# Patient Record
Sex: Male | Born: 1985 | Race: Black or African American | Hispanic: No | Marital: Single | Smoking: Never smoker
Health system: Southern US, Community
[De-identification: ages and names within clinical notes are randomized; demographics above are authoritative.]

## PROBLEM LIST (undated history)

## (undated) DIAGNOSIS — I456 Pre-excitation syndrome: Secondary | ICD-10-CM

## (undated) DIAGNOSIS — I471 Supraventricular tachycardia, unspecified: Secondary | ICD-10-CM

## (undated) DIAGNOSIS — A6000 Herpesviral infection of urogenital system, unspecified: Secondary | ICD-10-CM

## (undated) DIAGNOSIS — A568 Sexually transmitted chlamydial infection of other sites: Secondary | ICD-10-CM

## (undated) HISTORY — DX: Supraventricular tachycardia: I47.1

## (undated) HISTORY — DX: Sexually transmitted chlamydial infection of other sites: A56.8

## (undated) HISTORY — DX: Supraventricular tachycardia, unspecified: I47.10

## (undated) HISTORY — DX: Herpesviral infection of urogenital system, unspecified: A60.00

## (undated) HISTORY — DX: Pre-excitation syndrome: I45.6

---
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
## 2006-06-03 ENCOUNTER — Emergency Department (HOSPITAL_COMMUNITY): Admission: EM | Admit: 2006-06-03 | Discharge: 2006-06-03 | Payer: Self-pay | Admitting: Family Medicine

## 2006-08-11 ENCOUNTER — Emergency Department (HOSPITAL_COMMUNITY): Admission: EM | Admit: 2006-08-11 | Discharge: 2006-08-11 | Payer: Self-pay | Admitting: Family Medicine

## 2006-08-29 ENCOUNTER — Emergency Department (HOSPITAL_COMMUNITY): Admission: EM | Admit: 2006-08-29 | Discharge: 2006-08-29 | Payer: Self-pay | Admitting: Family Medicine

## 2006-09-15 ENCOUNTER — Emergency Department (HOSPITAL_COMMUNITY): Admission: EM | Admit: 2006-09-15 | Discharge: 2006-09-15 | Payer: Self-pay | Admitting: Emergency Medicine

## 2006-11-17 DIAGNOSIS — L708 Other acne: Secondary | ICD-10-CM | POA: Insufficient documentation

## 2007-01-02 ENCOUNTER — Emergency Department (HOSPITAL_COMMUNITY): Admission: EM | Admit: 2007-01-02 | Discharge: 2007-01-02 | Payer: Self-pay | Admitting: Family Medicine

## 2007-09-19 ENCOUNTER — Encounter: Payer: Self-pay | Admitting: Internal Medicine

## 2008-07-06 ENCOUNTER — Emergency Department (HOSPITAL_COMMUNITY): Admission: EM | Admit: 2008-07-06 | Discharge: 2008-07-06 | Payer: Self-pay | Admitting: Emergency Medicine

## 2008-12-13 ENCOUNTER — Ambulatory Visit: Payer: Self-pay | Admitting: Internal Medicine

## 2008-12-13 DIAGNOSIS — J069 Acute upper respiratory infection, unspecified: Secondary | ICD-10-CM | POA: Insufficient documentation

## 2008-12-13 DIAGNOSIS — A6000 Herpesviral infection of urogenital system, unspecified: Secondary | ICD-10-CM | POA: Insufficient documentation

## 2008-12-13 DIAGNOSIS — Z8679 Personal history of other diseases of the circulatory system: Secondary | ICD-10-CM

## 2011-02-05 NOTE — Op Note (Signed)
NAME:  Roberto Bowen, Roberto Bowen                          ACCOUNT NO.:  [id number]   MEDICAL RECORD NO.:  [id number]                   PATIENT TYPE:  OIB   LOCATION:  2854                                 FACILITY:  [facility]   PHYSICIAN:  Doylene Canning. Ladona Ridgel, M.D. [facility]           DATE OF BIRTH:  April 15, 1986   DATE OF PROCEDURE:  09/19/2002  DATE OF DISCHARGE:                                 OPERATIVE REPORT   PROCEDURE:  Invasive electrophysiologic study with radiofrequency ablation  of a concealed right posterolateral accessory pathway resulting in the  initiation of AV re-entrant tachycardia.   INTRODUCTION:  The patient is a very pleasant 25 year old young man with a  several year history of tachypalpitations.  He has had documented SVT at  rates of up to 230 beats per minute.  This will typically stop with  adenosine.  The patient has been treated with beta blockers as well as  procainamide but had continued symptoms and is now referred for  electrophysiologic study and catheter ablation.   DESCRIPTION OF PROCEDURE:  After informed consent was obtained, the patient  was taken to the diagnostic EP lab in the fasted state.  After the usual  preparation and draping, intravenous fentanyl and midazolam were given for  sedation.  A 6 French hexapolar catheter was inserted percutaneously in the  right jugular vein and advanced to the coronary sinus.  A 5 French  quadripolar catheter was inserted percutaneously in the right femoral vein  and advanced to the His bundle region.  A 5 French quadripolar catheter was  inserted percutaneously in the right femoral vein and advanced to the RV  apex.  After measurement of the basic intervals, rapid ventricular pacing  was carried out from the RV apex, resulting in the initiation of SVT.  During SVT the patient's QRS became both narrow as well as wide at different  times.  When it was wide, it was in a right bundle branch block  configuration.  The tachycardia could  be terminated with ventricular pacing.  The atrial activation sequence initially was earliest in the His A.  It  should be noted that PVCs placed at the time of His bundle refractoriness  could pre-excite the atrium.  In addition, the tachycardia could be  terminated with atrial pacing.  During tachycardia the patient's RR interval  changed between right bundle branch block and narrow QRS tachycardia.  During right bundle branch block the cycle length was approximately 30 msec  longer than during narrow QRS conduction.  This strongly suggested a right-  sided accessory pathway.  In addition, the [place] time was markedly prolonged  during right bundle branch block compared to narrow QRS conduction.  Because  of the high likelihood of a right-sided accessory pathway, the 5 [place]  catheter was removed from the right ventricle and the 7 French 20-pole halo  catheter was inserted and placed into the right atrium.  Tachycardia was  again initiated, and this demonstrated the earliest atrial activation in  tachycardia to be in the posterolateral region of the tricuspid valve,  approximately 7 o'clock on the valve annulus.  The 7 French quadripolar  ablation catheter (Cordis) was subsequently advanced into the right  ventricle and withdrawn back into the tricuspid valve region.  Mapping was  carried out with the ablation catheter in tachycardia.  A total of 6 RF  energy applications, including four bonus RF energy applications, were  delivered.  These were delivered predominantly to the atrial insertion of  the pathway.  Following the second RF energy application, there was no  evidence of any inducible tachycardia.  As noted, four bonus RF energy  applications were delivered.  Pacing was then carried out from the right  ventricle as well as the right atrium, and this demonstrated no inducible  SVT.  In addition, there was no evidence of any accessory pathway  conduction.  Isoproterenol was then  infused and additional pacing was  carried out, both from the coronary sinus as well as the right atrium and  the right ventricle.  Programmed atrial stimulation, rapid atrial pacing,  rapid ventricular pacing, and programmed ventricular stimulation were all  carried out down to refractoriness, and there was no inducible SVT.  Adenosine was then infused (12 mg) during ventricular pacing, and this  resulted in the initiation of atrial fibrillation, which degenerated into  atrial flutter.  At this point the patient was observed for several minutes  and his atrial flutter terminated.  The catheters were then removed,  hemostasis was assured, and the patient returned to his room in satisfactory  condition.   COMPLICATIONS:  There were no immediate procedural complications.   RESULTS:  A.  BASELINE ELECTROCARDIOGRAM:  The baseline ECG demonstrates  normal sinus rhythm with a short PR interval but no overt pre-excitation.   B.  BASELINE INTERVALS:  The sinus node cycle length was 815 msec.  The AH  interval was 57 msec, the HV interval 47 msec, the QRS duration  approximately 75 msec.   C.  RAPID VENTRICULAR PACING:  Rapid ventricular pacing was carried out from  the RV apex demonstrating a VA Wenckebach cycle length of 300 msec.  During  isoproterenol infusion the VA Wenckebach cycle length was 260 msec.   D.  PROGRAMMED VENTRICULAR STIMULATION:  Programmed ventricular stimulation  was carried out from the RV apex at basic drive cycle lengths of both 500  and 400 msec.  The S1-S2 interval was stepwise decreased down to ventricular  refractoriness.  The retrograde AV node ERP was subsequently found to be  500/280.   E.  RAPID ATRIAL PACING:  Rapid atrial pacing was carried out from the  coronary sinus at a pacing cycle length of 500 msec and stepwise decreased  down to 380 msec following catheter ablation,  where AV Wenckebach was observed.  During rapid atrial pacing the PR interval  remained less than the  RR interval.  Following catheter ablation there was no inducible SVT with  rapid atrial pacing.   F.  PROGRAMMED ATRIAL STIMULATION:  Programmed atrial stimulation was  carried out from the coronary sinus at a basic drive cycle length of 161  msec.  The S1-S2 interval was stepwise decreased from 440 msec down to 310  msec, where the AV node ERP was observed.  During programmed atrial  stimulation there were no AH jumps.  There were occasional echo beats.  In  addition, during programmed atrial stimulation there was inducible SVT prior  to ablation but not after ablation.   G.  ARRHYTHMIAS OBSERVED:  1. AV reentrant tachycardia.  Initiation:  Rapid atrial and rapid     ventricular pacing.  Duration:  Sustained.  Termination:  With rapid     ventricular pacing and catheter ablation.  2. Atrial flutter.  Initiation:  During adenosine infusion with concomitant     isoproterenol.  Duration:  Sustained.  Termination:  Spontaneous.   H.  MAPPING:  Mapping of the patient's SVT demonstrated a right  posterolateral accessory pathway, approximately 7 o'clock on the tricuspid  valve annulus.   A. RADIOFREQUENCY ENERGY APPLICATION:  A total of six RF energy applications     were delivered.  During the second RF energy application there was no     evidence of residual accessory pathway conduction.  Four bonus RF energy     applications were delivered predominantly to the atrial insertion of the     accessory pathway approximately 7 o'clock on the tricuspid valve annulus.   CONCLUSION:  This study demonstrates concealed right posterolateral  accessory pathway and is status post successful electrophysiologic study and  catheter ablation with a total of six RF energy applications delivered to  the pathway, rendering the tachycardia noninducible and demonstrating no  evidence of any residual accessory pathway conduction.  In addition, the  patient had atrial flutter during  adenosine infusion on isoproterenol.  This  is of unknown clinical significance.  As it stopped spontaneously, no  ablation therapy was delivered to the atrial flutter and no isthmus  modification of atrial flutter was carried out, as the patient did not  appear to have atrial flutter in any clinical sense.                                                Doylene Canning. Ladona Ridgel, M.D. [facility]    GWT/MEDQ  D:  09/19/2002  T:  09/19/2002  Job:  [id number]   cc:   Elsie Stain, M.D.  [facility]  [address]  [place] [address]  Fax: [phone]   Pricilla Riffle, M.D. [facility]  [address]  [place]  [place] [address]  Fax: [phone]   Duke Salvia, M.D. [facility]   Kathrine Cords, [facility]

## 2011-02-05 NOTE — Discharge Summary (Signed)
   NAME:  Roberto Bowen, Roberto Bowen NO.:  [id number]   MEDICAL RECORD NO.:  [id number]                   PATIENT TYPE:  OIB   LOCATION:  2031                                 FACILITY:  [facility]   PHYSICIAN:  Doylene Canning. Ladona Ridgel, M.D. [facility]           DATE OF BIRTH:  [birth year]   DATE OF ADMISSION:  09/19/2002  DATE OF DISCHARGE:  09/20/2002                           DISCHARGE SUMMARY - REFERRING   SUMMARY OF HISTORY:  The patient is a 25 year old black male who has  presented to the hospital in the past with incessant supraventricular  tachycardia that has failed to respond to adenosine, Cardizem, Lopressor,  and ultimately required procainamide.  He was treated with Inderal 80 mg  without any further SVT but he describes some fatigue and sluggishness.  After being seen in the office on September 11, 2002 with Dr. Graciela Husbands, they  discussed options of definitive treatment.  The patient and the mother  agreed to proceed with possible ablation.   LABORATORY AND ACCESSORY DATA:  Preadmission H&H was 15.4 and 47.7, normal  indices, platelets 464, WBC 7.6.  PTT 28.2, PT 11.7.  Sodium 139, potassium  4.2, BUN 9. creatinine 9.8.   Echocardiogram showed normal LV function, mild MR.  EKG showed sinus  bradycardia with ventricular rate of 59,  P-R interval was 130.   HOSPITAL COURSE:  The patient was brought in on December 31st and underwent  EPS and RFA of SVT without immediate complications by Dr. Ladona Ridgel.  Please  refer to his dictated note.  Postprocedure, he was doing well, was  ambulating in the hall without difficulty after his bed rest on September 20, 2002.  After review, Dr. Ladona Ridgel felt that he could be discharged home after  his ablation of the posterior lateral pathway.   DISCHARGE INSTRUCTIONS:  He gave him instructions of no strenuous activity  until September 22, 2002.  He was given permission to go back to school on  January 6th.   DISCHARGE MEDICATIONS:  He also  recommended aspirin 325 mg for one month and  antibiotic prophylaxis for six months for any dental work.   FOLLOW UP:  He will follow up with Dr. Ladona Ridgel in approximately 6-12 weeks.   DISCHARGE DIAGNOSIS:  Supraventricular tachycardia status post ablation.     Roberto Bowen, P.A. [facility]                    Doylene Canning. Ladona Ridgel, M.D. [facility]    EW/MEDQ  D:  09/20/2002  T:  09/20/2002  Job:  [id number]

## 2011-02-05 NOTE — Consult Note (Signed)
NAME:  Roberto Bowen, DUCE NO.:  [id number]   MEDICAL RECORD NO.:  [id number]                   PATIENT TYPE:  EMS   LOCATION:  MAJO                                 FACILITY:  [facility]   PHYSICIAN:  Duke Salvia, M.D. [facility]           DATE OF BIRTH:  [birth year]   DATE OF CONSULTATION:  08/26/2002  DATE OF DISCHARGE:                                   CONSULTATION   REASON FOR CONSULTATION:  This is a 25 year old African American male who  presented to the emergency room with known complex tachycardia.  He had  received 12 mg of adenosine per Dr. Pearletha Alfred, with termination, then  resumption of his tachycardia; because of that, consultation was requested.   HISTORY OF PRESENT ILLNESS:  He has a two-year history of recurrent abrupt  onset of tachy-palpitations that have been occurring recently a couple of  times a week.  They typically last 8 to 10 minutes, are associated with some  lightheadedness, shortness of breath and chest pain.  This morning, he  awakened with his rapid heart beat with persistent chest pain, shortness of  breath and presyncope.  He came to the emergency room with a history as  above.   He has no history of exercise intolerance.   PAST MEDICAL HISTORY:  His past medical history is largely negative.  Vaccines are up to date.   HABITS:  He does not use cigarettes, alcohol or recreational drugs.   FAMILY HISTORY:  He has seven siblings and lives with his mother.   SOCIAL HISTORY:  He is a Recruitment consultant.  He loves playing basketball.  He has episodes that occur both with exercise and with rest.   ALLERGIES:  He has no known drug allergies.   PHYSICAL EXAMINATION:  GENERAL:  On examination, he is a young [place]  American male in no acute distress.  VITAL SIGNS:  His blood pressure was auscultated between 70 and 105 with  heart rates at the 220 range.  HEENT:  Exam demonstrated no scleral icterus and no xanthomata.  NECK:   His neck veins were pulsating.  BACK:  His back was without kyphosis or scoliosis.  LUNGS:  His lungs were clear.  HEART:  His heart sounds were regular.  No murmurs or gallops were  appreciated.  ABDOMEN:  The abdomen was soft with active bowel sounds.  Femoral pulses  were 2+.  EXTREMITIES:  Distal pulses were intact and there was no clubbing, cyanosis,  or edema.  NEUROLOGIC:  The neurological exam was grossly normal.  SKIN:  The skin was warm and dry.   LABORATORY AND ACCESSORY CLINICAL DATA:  Electrocardiogram demonstrated a  narrow QRS tachycardia with cycle length of 260 msec.   Review of the strips with adenosine demonstrated termination of the  tachycardia with resumption with a PVC.  In termination, there appeared to  be no isoelectrical P-R  interval.  A short run with a 12-lead at that same  time also demonstrated no evidence of isoelectric P-R interval, particularly  the __________.   IMPRESSION:  Supraventricular tachycardia probably associated with  ventricular preexcitation, i.e., Wolff-Parkinson-White syndrome.   RECOMMENDATIONS:  1. The following is an outline of the plan that was recommended and     accomplished and is dictated as such:  Initially, we tried adenosine     again and this was again associated with termination of the tachycardia     and resumption.  With a defibrillator then the room, verapamil 2.5 mg was     given twice, with some slowing of the tachycardia, but then the patient     developed a significant irregularity and wide complex beats consistent     with aberration or preexcitation.  Occasional sinus beats were seen,     suggesting that there was a beat of atrial irritability.  Ultimately, the     patient developed no evidence of intrinsic sinus rhythm; atrial     fibrillation was clearly present.  In this interlude, however,     procainamide was started at 70 mg/kg over 20 minutes.  Because there was     lack of effect at the end of the  infusion of this, anesthesia was called     to anticipate D-C cardioversion of his atrial fibrillation.  Just prior     to this, he reverted back into SVT and then abruptly broke back into     sinus rhythm.  His electrocardiogram, post termination, demonstrates no     evidence ventricular preexcitation, albeit in the context of procainamide     infusion.   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   I will give him a prescription for Inderal LA 80 mg.  He will need a 2-D  echocardiogram and I will plan to see him in the office in two weeks' time.   I had an extensive discussion with the patient's mother and aunt regarding  potential risks of sudden cardiac death, which I would estimate at least 1%  to 3% per year; we also discussed the potential therapeutic benefits as well  as the potential risks of RF catheter ablation including, but not limited  to, 1-in-a-1000 risk of heart attacks, stroke and/or dying, a 1-in-250 risk  of perforation and 1-in-100 risk of iatrogenic heart block requiring  pacemaker implantation.                                               Duke Salvia, M.D. [facility]    SCK/MEDQ  D:  08/26/2002  T:  08/27/2002  Job:  [id number]

## 2014-02-06 ENCOUNTER — Encounter: Payer: Self-pay | Admitting: Cardiovascular Disease

## 2014-02-06 ENCOUNTER — Ambulatory Visit (INDEPENDENT_AMBULATORY_CARE_PROVIDER_SITE_OTHER): Payer: PRIVATE HEALTH INSURANCE | Admitting: Cardiovascular Disease

## 2014-02-06 VITALS — BP 138/90 | HR 79 | Ht 74.0 in | Wt 208.0 lb

## 2014-02-06 DIAGNOSIS — I471 Supraventricular tachycardia: Secondary | ICD-10-CM

## 2014-02-06 DIAGNOSIS — I498 Other specified cardiac arrhythmias: Secondary | ICD-10-CM

## 2014-02-06 LAB — BASIC METABOLIC PANEL
BUN: 12 mg/dL (ref 6–23)
CO2: 29 mEq/L (ref 19–32)
Calcium: 9.6 mg/dL (ref 8.4–10.5)
Chloride: 104 mEq/L (ref 96–112)
Creatinine, Ser: 1.1 mg/dL (ref 0.4–1.5)
GFR: 109.21 mL/min (ref >60.00)
Glucose, Bld: 81 mg/dL (ref 70–99)
Potassium: 3.7 mEq/L (ref 3.5–5.1)
Sodium: 139 mEq/L (ref 135–145)

## 2014-02-06 LAB — TSH: TSH: 0.81 u[IU]/mL (ref 0.35–4.50)

## 2014-02-06 NOTE — Patient Instructions (Addendum)
You have been referred to Dr. Graciela HusbandsKlein or Dr Ladona Ridgelaylor.  Please schedule appointment for about 5-6 weeks from now.    Your physician has requested that you have an echocardiogram. Echocardiography is a painless test that uses sound waves to create images of your heart. It provides your doctor with information about the size and shape of your heart and how well your heart's chambers and valves are working. This procedure takes approximately one hour. There are no restrictions for this procedure.  Your physician has recommended that you wear an event monitor. Event monitors are medical devices that record the heart's electrical activity. Doctors most often us these monitors to diagnose arrhythmias. Arrhythmias are problems with the speed or rhythm of the heartbeat. The monitor is a small, portable device. You can wear one while you do your normal daily activities. This is usually used to diagnose what is causing palpitations/syncope (passing out).

## 2014-02-06 NOTE — Progress Notes (Signed)
     History of Present Illness: 28 yo AAM with history of SVT (WPW) s/p ablation per Dr. Ladona Ridgelaylor in 2003 who is here today as a new patient for evaluation of palpitations. He saw Dr. 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 He underwent ablation 09/19/02 per Dr. Ladona Ridgelaylor. He has not seen a doctor in years. No EP f/u over last 10 years. He tells me that he had an episode of tachycardia 2 weeks ago. He felt his heart racing while sitting down. This lasted for four hours and felt much like his prior episodes of tachycardia. He felt dyspneic during the episode. He did not have syncope. No chest pain or exertional SOB. This is his first episode of tachycardia since 2003 following ihs ablation.   Primary Care Physician: None  Past Medical History  Diagnosis Date  . SVT (supraventricular tachycardia)     Past Surgical History  Procedure Laterality Date  . Svt ablation  2003    No current outpatient prescriptions on file.   No current facility-administered medications for this visit.    All: NKDA  History   Social History  . Marital Status: Single    Spouse Name: N/A    Number of Children: 2  . Years of Education: N/A   Occupational History  . Works at [employer]    Social History Main Topics  . Smoking status: Never Smoker   . Smokeless tobacco: Not on file  . Alcohol Use: Yes     Comment: 1 drink per week  . Drug Use: 5.00 per week    Special: Marijuana  . Sexual Activity: Not on file   Other Topics Concern  . Not on file   Social History Narrative  . No narrative on file    Family History  Problem Relation Age of Onset  . Heart attack Mother   . Hypertension Mother   . Heart attack Father     Review of Systems:  As stated in the HPI and otherwise negative.   BP 138/90  Pulse 79  Ht 6\' 2"  (1.88 m)  Wt 208 lb (94.348 kg)  BMI 26.69 kg/m2  Physical Examination: General: Well developed, well nourished, NAD HEENT: OP clear, mucus membranes  moist SKIN: warm, dry. No rashes. Neuro: No focal deficits Musculoskeletal: Muscle strength 5/5 all ext Psychiatric: Mood and affect normal Neck: No JVD, no carotid bruits, no thyromegaly, no lymphadenopathy. Lungs:Clear bilaterally, no wheezes, rhonci, crackles Cardiovascular: Regular rate and rhythm. No murmurs, gallops or rubs. Abdomen:Soft. Bowel sounds present. Non-tender.  Extremities: No lower extremity edema. Pulses are 2 + in the bilateral DP/PT.  EKG: NSR, rate 79 bpm. Normal EKG. No delta waves appreciated.   Assessment and Plan:   1. SVT: He has a complex EP history with presentation in 2003 with SVT. He was seen then by Dr. Graciela HusbandsKlein and his presentation was c/w WPW. He underwent ablation per Dr. Ladona Ridgelaylor 09/19/02 and has done well since then. Now with recurrent symptoms c/w SVT. Will arrange echo to assess LVEF. Will arrange 21 day event monitor. Will check BMET, TSH. Will schedule f/u in EP clinic in 3-4 weeks. He will follow in the EP clinic.

## 2014-02-18 ENCOUNTER — Encounter: Payer: Self-pay | Admitting: Family Medicine

## 2014-02-18 ENCOUNTER — Other Ambulatory Visit (HOSPITAL_COMMUNITY)
Admission: RE | Admit: 2014-02-18 | Discharge: 2014-02-18 | Disposition: A | Discharge status: Home / Self Care | Payer: PRIVATE HEALTH INSURANCE | Source: Ambulatory Visit | Attending: Family Medicine | Admitting: Family Medicine

## 2014-02-18 ENCOUNTER — Ambulatory Visit (INDEPENDENT_AMBULATORY_CARE_PROVIDER_SITE_OTHER): Payer: PRIVATE HEALTH INSURANCE | Admitting: Family Medicine

## 2014-02-18 VITALS — BP 142/71 | HR 87 | Temp 98.9°F | Ht 74.0 in | Wt 208.0 lb

## 2014-02-18 DIAGNOSIS — Z202 Contact with and (suspected) exposure to infections with a predominantly sexual mode of transmission: Secondary | ICD-10-CM

## 2014-02-18 DIAGNOSIS — Z113 Encounter for screening for infections with a predominantly sexual mode of transmission: Secondary | ICD-10-CM | POA: Insufficient documentation

## 2014-02-18 DIAGNOSIS — A6000 Herpesviral infection of urogenital system, unspecified: Secondary | ICD-10-CM

## 2014-02-18 DIAGNOSIS — A568 Sexually transmitted chlamydial infection of other sites: Secondary | ICD-10-CM

## 2014-02-18 DIAGNOSIS — I456 Pre-excitation syndrome: Secondary | ICD-10-CM

## 2014-02-18 MED ORDER — VALACYCLOVIR HCL 1 G PO TABS
1000.0000 mg | ORAL_TABLET | Freq: Every day | ORAL | Status: DC
Start: 2014-02-18 — End: 2015-10-01

## 2014-02-18 NOTE — Assessment & Plan Note (Signed)
Reports multiple partners and requesting testing - urine gc/ct - hiv/rpr - will call with results

## 2014-02-18 NOTE — Progress Notes (Signed)
   Subjective:    Patient ID: Roberto Bowen, male    DOB: 1986-05-11, 28 y.o.   MRN: [id number]  Exposure to STD Associated symptoms include shortness of breath.   Pt presents to establish care. No active complaints. Reports multiple partners and would like STD testing.  PMH:  WPW - s/p ablation in 2003 - followed by cardiology Genital herpes - frequent outbreaks, previously on suppression  No allergies No surgeries besides ablation above  SH:  No tobacco Smokes marijuana 2x per day Alcohol - 2-3 drinks about 1x per week Multiple sexual partners - wants testing for STDs  FH: Hypertension, DM and MI in mother and father   Review of Systems  Respiratory: Positive for shortness of breath.   Cardiovascular: Positive for palpitations.  All other systems reviewed and are negative.      Objective:   Physical Exam  Nursing note and vitals reviewed. Constitutional: He is oriented to person, place, and time. He appears well-developed and well-nourished. No distress.  HENT:  Head: Normocephalic and atraumatic.  Nose: Nose normal.  Mouth/Throat: Oropharynx is clear and moist. No oropharyngeal exudate.  Eyes: Conjunctivae are normal. Pupils are equal, round, and reactive to light. Right eye exhibits no discharge. Left eye exhibits no discharge. No scleral icterus.  Neck: Normal range of motion. Neck supple.  Cardiovascular: Normal rate, regular rhythm and normal heart sounds.   No murmur heard. Pulmonary/Chest: Effort normal and breath sounds normal. No respiratory distress. He has no wheezes.  Abdominal: Soft. Bowel sounds are normal. He exhibits no distension and no mass. There is no tenderness. There is no rebound and no guarding.  Musculoskeletal: Normal range of motion. He exhibits no edema and no tenderness.  Neurological: He is alert and oriented to person, place, and time.  Skin: Skin is warm and dry. He is not diaphoretic.  Psychiatric: He has a normal mood and affect.  His behavior is normal.       Assessment & Plan:

## 2014-02-18 NOTE — Patient Instructions (Signed)
Palpitations  A palpitation is the feeling that your heartbeat is irregular or is faster than normal. It may feel like your heart is fluttering or skipping a beat. Palpitations are usually not a serious problem. However, in some cases, you may need further medical evaluation. CAUSES  Palpitations can be caused by: Smoking. Caffeine or other stimulants, such as diet pills or energy drinks. Alcohol. Stress and anxiety. Strenuous physical activity. Fatigue. Certain medicines. Heart disease, especially if you have a history of arrhythmias. This includes atrial fibrillation, atrial flutter, or supraventricular tachycardia. An improperly working pacemaker or defibrillator. DIAGNOSIS  To find the cause of your palpitations, your caregiver will take your history and perform a physical exam. Tests may also be done, including: Electrocardiography (ECG). This test records the heart's electrical activity. Cardiac monitoring. This allows your caregiver to monitor your heart rate and rhythm in real time. Holter monitor. This is a portable device that records your heartbeat and can help diagnose heart arrhythmias. It allows your caregiver to track your heart activity for several days, if needed. Stress tests by exercise or by giving medicine that makes the heart beat faster. TREATMENT  Treatment of palpitations depends on the cause of your symptoms and can vary greatly. Most cases of palpitations do not require any treatment other than time, relaxation, and monitoring your symptoms. Other causes, such as atrial fibrillation, atrial flutter, or supraventricular tachycardia, usually require further treatment. HOME CARE INSTRUCTIONS  Avoid: Caffeinated coffee, tea, soft drinks, diet pills, and energy drinks. Chocolate. Alcohol. Stop smoking if you smoke. Reduce your stress and anxiety. Things that can help you relax include: A method that measures bodily functions so you can learn to control them  (biofeedback). Yoga. Meditation. Physical activity such as swimming, jogging, or walking. Get plenty of rest and sleep. SEEK MEDICAL CARE IF:  You continue to have a fast or irregular heartbeat beyond 24 hours. Your palpitations occur more often. SEEK IMMEDIATE MEDICAL CARE IF: You develop chest pain or shortness of breath. You have a severe headache. You feel dizzy, or you faint. MAKE SURE YOU: Understand these instructions. Will watch your condition. Will get help right away if you are not doing well or get worse.

## 2014-02-18 NOTE — Assessment & Plan Note (Signed)
Many years of herpes infection, more frequent outbreaks recently, treated with valtrex in the past - restart valtrex suppression

## 2014-02-19 LAB — RPR

## 2014-02-19 LAB — HIV ANTIBODY (ROUTINE TESTING W REFLEX): HIV 1&2 Ab, 4th Generation: NONREACTIVE

## 2014-02-20 ENCOUNTER — Telehealth: Payer: Self-pay | Admitting: *Deleted

## 2014-02-20 NOTE — Telephone Encounter (Signed)
Left message for patient to return call, he needs to be notified he tested positive for chlamydia and scheduled for a nurse visit for treatment.Doris Cheadle Busick

## 2014-02-21 ENCOUNTER — Ambulatory Visit (INDEPENDENT_AMBULATORY_CARE_PROVIDER_SITE_OTHER): Payer: PRIVATE HEALTH INSURANCE | Admitting: *Deleted

## 2014-02-21 DIAGNOSIS — A568 Sexually transmitted chlamydial infection of other sites: Secondary | ICD-10-CM | POA: Insufficient documentation

## 2014-02-21 DIAGNOSIS — A749 Chlamydial infection, unspecified: Secondary | ICD-10-CM

## 2014-02-21 MED ORDER — AZITHROMYCIN 500 MG PO TABS
1000.0000 mg | ORAL_TABLET | Freq: Once | ORAL | Status: AC
  Administered 2014-02-21: 1000 mg via ORAL

## 2014-02-21 MED ORDER — AZITHROMYCIN 250 MG PO TABS: 1000.0000 mg | ORAL_TABLET | Freq: Once | ORAL | Status: DC

## 2014-02-21 NOTE — Addendum Note (Signed)
Addended by: Abram Sander on: 02/21/2014 12:07 PM   Modules accepted: Orders

## 2014-02-21 NOTE — Telephone Encounter (Signed)
Pt called and was told about his results and he scheduled a nurse visit for 6/4. jw

## 2014-02-21 NOTE — Progress Notes (Signed)
   Pt in nurse clinic for chlamydia treatment.  Pt advised no sex x 7 days or until partner has been tested and treated.  Condoms with all sex, declined condoms today.  Azithromycin  1 gm PO x 1 given per verbal order by Dr. Richarda Blade.  Clovis Pu, RN

## 2014-02-21 NOTE — Assessment & Plan Note (Signed)
Azithro 1g x1

## 2014-02-21 NOTE — Telephone Encounter (Signed)
LVM for message for patient to call back.Roberto Bowen

## 2014-02-21 NOTE — Telephone Encounter (Signed)
Form given to nurse to complete and fax to health department once patient comes in for treatment.Doris Cheadle Busick

## 2014-02-25 ENCOUNTER — Other Ambulatory Visit (HOSPITAL_COMMUNITY): Payer: PRIVATE HEALTH INSURANCE

## 2014-02-27 ENCOUNTER — Encounter (INDEPENDENT_AMBULATORY_CARE_PROVIDER_SITE_OTHER): Payer: PRIVATE HEALTH INSURANCE

## 2014-02-27 ENCOUNTER — Ambulatory Visit (HOSPITAL_COMMUNITY): Payer: PRIVATE HEALTH INSURANCE

## 2014-02-27 ENCOUNTER — Encounter: Payer: Self-pay | Admitting: *Deleted

## 2014-02-27 DIAGNOSIS — I498 Other specified cardiac arrhythmias: Secondary | ICD-10-CM

## 2014-02-27 DIAGNOSIS — I471 Supraventricular tachycardia: Secondary | ICD-10-CM

## 2014-02-27 NOTE — Progress Notes (Signed)
Patient ID: Roberto Bowen, male   DOB: Sep 13, 1986, 28 y.o.   MRN: [id number] E-Cardio verite 30 day cardiac event monitor applied to patient.

## 2014-03-13 ENCOUNTER — Encounter: Payer: Self-pay | Admitting: Internal Medicine

## 2014-03-13 ENCOUNTER — Ambulatory Visit (INDEPENDENT_AMBULATORY_CARE_PROVIDER_SITE_OTHER): Payer: PRIVATE HEALTH INSURANCE | Admitting: Internal Medicine

## 2014-03-13 VITALS — BP 136/79 | HR 73 | Ht 74.0 in | Wt 208.0 lb

## 2014-03-13 DIAGNOSIS — Z8679 Personal history of other diseases of the circulatory system: Secondary | ICD-10-CM

## 2014-03-13 DIAGNOSIS — R002 Palpitations: Secondary | ICD-10-CM

## 2014-03-13 MED ORDER — METOPROLOL TARTRATE 50 MG PO TABS: ORAL_TABLET | ORAL | Status: DC

## 2014-03-13 NOTE — Patient Instructions (Signed)
Your physician wants you to follow-up in: 6 MONTHS WITH DR. Ladona RidgelAYLOR. You will receive a reminder letter in the mail two months in advance. If you don't receive a letter, please call our office to schedule the follow-up appointment. PER DR. Ladona RidgelAYLOR IF YOU ARE FEELING FINE YOU CAN FOLLOW UP AS NEEDED  AN RX FOR METOPROLOL TARTRATE 50 MG 1 TABLET AS NEEDED FOR PALPITATIONS

## 2014-03-14 ENCOUNTER — Encounter: Payer: Self-pay | Admitting: Internal Medicine

## 2014-03-14 NOTE — Progress Notes (Signed)
      HPI 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 He had a right posteroseptal pathway. He has done well over the years. He was visiting friends at the beach when he experienced a recurrent episode of tachypalpitations. He did not seek medical attention. He admits to consuming more caffeine he should. He has worn a cardiac monitor and no arrhythmias have been observed. No syncope. When he felt palpitations, he was not sure of whether his heart was beating fast and irregular or regular. No Known Allergies   Current Outpatient Prescriptions  Medication Sig Dispense Refill  . valACYclovir (VALTREX) 1000 MG tablet Take 1 tablet (1,000 mg total) by mouth daily.  30 tablet  12  . metoprolol (LOPRESSOR) 50 MG tablet AS NEEDED FOR PALPITATIONS  30 tablet  0   No current facility-administered medications for this visit.     Past Medical History  Diagnosis Date  . SVT (supraventricular tachycardia)   . Acne   . Genital herpes   . PSVT (paroxysmal supraventricular tachycardia)   . Possible exposure to STD   . 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 Chlamydia trachomatis infection     ROS:   All systems reviewed and negative except as noted in the HPI.   Past Surgical History  Procedure Laterality Date  . Svt ablation  2003  . Ablation of dysrhythmic focus  2003     Family History  Problem Relation Age of Onset  . Heart attack Mother   . Hypertension Mother   . Heart disease Mother   . Diabetes Mother   . Heart attack Father   . Heart disease Father   . Diabetes Father   . Early death Father      History   Social History  . Marital Status: Single    Spouse Name: N/A    Number of Children: 2  . Years of Education: N/A   Occupational History  . Works at [employer]    Social History Main Topics  . Smoking status: Never Smoker   . Smokeless  tobacco: Not on file  . Alcohol Use: Yes     Comment: 1 drink per week  . Drug Use: 5.00 per week    Special: Marijuana  . Sexual Activity: Not on file   Other Topics Concern  . Not on file   Social History Narrative  . No narrative on file     BP 136/79  Pulse 73  Ht 6\' 2"  (1.88 m)  Wt 208 lb (94.348 kg)  BMI 26.69 kg/m2  Physical Exam:  Well appearing young man, NAD HEENT: Unremarkable Neck:  No JVD, no thyromegally Back:  No CVA tenderness Lungs:  Clear with no wheezes HEART:  Regular rate rhythm, no murmurs, no rubs, no clicks Abd:  soft, positive bowel sounds, no organomegally, no rebound, no guarding Ext:  2 plus pulses, no edema, no cyanosis, no clubbing Skin:  No rashes no nodules Neuro:  CN II through XII intact, motor grossly intact  EKG - nsr with no pre-excitation  Assess/Plan:

## 2014-03-14 NOTE — Assessment & Plan Note (Signed)
It is unclear whether he has had any sustained arrhythmia or whether he was experienced PVC/PAC and sinus tachycardia or SVT or atrial fib. I have recommended a period of watchful waiting. He is encouraged to come to our office or go to the fire station if he has recurrent symptoms. There is no evidence of recurrent AP conduction.

## 2014-03-20 ENCOUNTER — Other Ambulatory Visit (HOSPITAL_COMMUNITY): Payer: PRIVATE HEALTH INSURANCE

## 2015-09-21 ENCOUNTER — Emergency Department (HOSPITAL_COMMUNITY): Payer: No Typology Code available for payment source

## 2015-09-21 ENCOUNTER — Emergency Department (HOSPITAL_COMMUNITY)
Admission: EM | Admit: 2015-09-21 | Discharge: 2015-09-21 | Disposition: A | Discharge status: Home / Self Care | Payer: No Typology Code available for payment source | Attending: Emergency Medicine | Admitting: Emergency Medicine

## 2015-09-21 ENCOUNTER — Encounter (HOSPITAL_COMMUNITY): Payer: Self-pay | Admitting: Nurse Practitioner

## 2015-09-21 DIAGNOSIS — Z79899 Other long term (current) drug therapy: Secondary | ICD-10-CM | POA: Insufficient documentation

## 2015-09-21 DIAGNOSIS — Z8619 Personal history of other infectious and parasitic diseases: Secondary | ICD-10-CM | POA: Insufficient documentation

## 2015-09-21 DIAGNOSIS — R0602 Shortness of breath: Secondary | ICD-10-CM | POA: Diagnosis present

## 2015-09-21 DIAGNOSIS — I4891 Unspecified atrial fibrillation: Secondary | ICD-10-CM | POA: Diagnosis not present

## 2015-09-21 LAB — BASIC METABOLIC PANEL
ANION GAP: 11 (ref 5–15)
BUN: 11 mg/dL (ref 6–20)
CHLORIDE: 103 mmol/L (ref 101–111)
CO2: 25 mmol/L (ref 22–32)
Calcium: 9.5 mg/dL (ref 8.9–10.3)
Creatinine, Ser: 0.94 mg/dL (ref 0.61–1.24)
GFR calc Af Amer: >60 mL/min (ref >60)
GLUCOSE: 97 mg/dL (ref 65–99)
POTASSIUM: 3.7 mmol/L (ref 3.5–5.1)
SODIUM: 139 mmol/L (ref 135–145)

## 2015-09-21 LAB — CBC
HEMATOCRIT: 48.9 % (ref 39.0–52.0)
HEMOGLOBIN: 17.4 g/dL — AB (ref 13.0–17.0)
MCH: 31.6 pg (ref 26.0–34.0)
MCHC: 35.6 g/dL (ref 30.0–36.0)
MCV: 88.7 fL (ref 78.0–100.0)
Platelets: 239 10*3/uL (ref 150–400)
RBC: 5.51 MIL/uL (ref 4.22–5.81)
RDW: 12.7 % (ref 11.5–15.5)
WBC: 15 10*3/uL — AB (ref 4.0–10.5)

## 2015-09-21 MED ORDER — METOPROLOL TARTRATE 1 MG/ML IV SOLN
5.0000 mg | Freq: Once | INTRAVENOUS | Status: AC
  Administered 2015-09-21: 5 mg via INTRAVENOUS
  Filled 2015-09-21: qty 5

## 2015-09-21 MED ORDER — RIVAROXABAN 20 MG PO TABS: 20.0000 mg | ORAL_TABLET | Freq: Every day | ORAL | Status: DC

## 2015-09-21 MED ORDER — RIVAROXABAN 20 MG PO TABS
20.0000 mg | ORAL_TABLET | Freq: Once | ORAL | Status: AC
  Administered 2015-09-21: 20 mg via ORAL
  Filled 2015-09-21: qty 1

## 2015-09-21 MED ORDER — PROPOFOL 10 MG/ML IV BOLUS
0.5000 mg/kg | Freq: Once | INTRAVENOUS | Status: AC
  Administered 2015-09-21: 44.7 mg via INTRAVENOUS
  Filled 2015-09-21: qty 20

## 2015-09-21 MED ORDER — ONDANSETRON HCL 4 MG/2ML IJ SOLN: 4.0000 mg | Freq: Once | INTRAMUSCULAR | Status: DC

## 2015-09-21 MED ORDER — PROPOFOL 10 MG/ML IV BOLUS
INTRAVENOUS | Status: AC | PRN
  Administered 2015-09-21: 44.7 mg via INTRAVENOUS

## 2015-09-21 MED ORDER — METOPROLOL TARTRATE 25 MG PO TABS
50.0000 mg | ORAL_TABLET | Freq: Once | ORAL | Status: AC
  Administered 2015-09-21: 50 mg via ORAL
  Filled 2015-09-21: qty 2

## 2015-09-21 NOTE — ED Notes (Addendum)
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 He states he felt like when he was in svt. He denies any pain. He is A&Ox4, reps e/u

## 2015-09-21 NOTE — Sedation Documentation (Signed)
MD at bedside, cardioverted pt 120j synchronized.

## 2015-09-21 NOTE — ED Provider Notes (Addendum)
CSN: [id number]     Arrival date & time 09/21/15  1306 History   First MD Initiated Contact with Patient 09/21/15 1331     Chief Complaint  Patient presents with  . Shortness of Breath  . Tachycardia     (Consider location/radiation/quality/duration/timing/severity/associated sxs/prior Treatment) Patient is a 30 y.o. male presenting with shortness of breath. The history is provided by the patient.  Shortness of Breath Associated symptoms: no abdominal pain, no chest pain, no fever, no headaches, no neck pain, no rash, no sore throat and no vomiting   Patient w hx svt c/o palpitations, onset at work, at rest, shortly pta today.  Hx same, has intermittently occurred in past - pt denies other recent episodes.  Indicates had seen cardiology years ago, denies knowing specific dx or taking any medication for same at home.  No recent or current chest pain or discomfort. No sob. No faintness.  States otherwise was in recent well state of health, asymptomatic. Mod caffeine use. No other drug/stimulant use.      Past Medical History  Diagnosis Date  . SVT (supraventricular tachycardia) (HCC)   . Acne   . Genital herpes   . PSVT (paroxysmal supraventricular tachycardia) (HCC)   . Possible exposure to STD   . 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 Chlamydia trachomatis infection    Past Surgical History  Procedure Laterality Date  . Svt ablation  2003  . Ablation of dysrhythmic focus  2003   Family History  Problem Relation Age of Onset  . Heart attack Mother   . Hypertension Mother   . Heart disease Mother   . Diabetes Mother   . Heart attack Father   . Heart disease Father   . Diabetes Father   . Early death Father    Social History  Substance Use Topics  . Smoking status: Never Smoker   . Smokeless tobacco: None  . Alcohol Use: Yes     Comment: 1 drink per week    Review of Systems  Constitutional: Negative for fever and chills.  HENT: Negative for  sore throat.   Eyes: Negative for redness.  Respiratory: Positive for shortness of breath.   Cardiovascular: Positive for palpitations. Negative for chest pain and leg swelling.  Gastrointestinal: Negative for vomiting and abdominal pain.  Genitourinary: Negative for flank pain.  Musculoskeletal: Negative for back pain and neck pain.  Skin: Negative for rash.  Neurological: Negative for syncope, light-headedness and headaches.  Hematological: Does not bruise/bleed easily.  Psychiatric/Behavioral: Negative for confusion.      Allergies  Review of patient's allergies indicates no known allergies.  Home Medications   Prior to Admission medications   Medication Sig Start Date End Date Taking? Authorizing Provider  metoprolol (LOPRESSOR) 50 MG tablet AS NEEDED FOR PALPITATIONS 03/13/14   Marinus MawGregg W Taylor, MD  valACYclovir (VALTREX) 1000 MG tablet Take 1 tablet (1,000 mg total) by mouth daily. 02/18/14   Abram SanderElena M Adamo, MD   BP 120/86 mmHg  Pulse 172  Temp(Src) 98.6 F (37 C) (Oral)  Resp 17  SpO2 97% Physical Exam  Constitutional: He is oriented to person, place, and time. He appears well-developed and well-nourished.  tachycardic  HENT:  Head: Atraumatic.  Eyes: Conjunctivae are normal. Pupils are equal, round, and reactive to light.  Neck: Neck supple. No tracheal deviation present. No thyromegaly present.  Cardiovascular: Normal heart sounds and intact distal pulses.  Exam reveals no gallop and no friction rub.  No murmur heard. Pulmonary/Chest: Effort normal and breath sounds normal. No accessory muscle usage. No respiratory distress.  Abdominal: Soft. Bowel sounds are normal. He exhibits no distension. There is no tenderness.  Musculoskeletal: Normal range of motion. He exhibits no edema or tenderness.  Neurological: He is alert and oriented to person, place, and time.  Skin: Skin is warm and dry.  Psychiatric: He has a normal mood and affect.  Nursing note and vitals  reviewed.   ED Course  .Cardioversion Date/Time: 09/21/2015 4:38 PM Performed by: Cathren Laine Authorized by: Cathren Laine Consent: Verbal consent obtained. Written consent obtained. Risks and benefits: risks, benefits and alternatives were discussed Consent given by: patient Patient understanding: patient states understanding of the procedure being performed Patient consent: the patient's understanding of the procedure matches consent given Procedure consent: procedure consent matches procedure scheduled Relevant documents: relevant documents present and verified Patient identity confirmed: verbally with patient, arm band and hospital-assigned identification number Time out: Immediately prior to procedure a "time out" was called to verify the correct patient, procedure, equipment, support staff and site/side marked as required. Patient sedated: yes Sedatives: propofol (deep sedation w propofol) Sedation start date/time: 09/21/2015 4:25 PM Sedation end date/time: 09/21/2015 4:39 PM Vitals: Vital signs were monitored during sedation. Cardioversion basis: emergent Pre-procedure rhythm: atrial fibrillation Patient position: patient was placed in a supine position Chest area: chest area exposed Electrodes: pads Number of attempts: 1 Attempt 1 mode: synchronous Attempt 1 shock (in Joules): 150 Attempt 1 outcome: conversion to normal sinus rhythm Post-procedure rhythm: normal sinus rhythm Complications: no complications Patient tolerance: Patient tolerated the procedure well with no immediate complications   (including critical care time) Labs Review   Results for orders placed or performed during the hospital encounter of 09/21/15  Basic metabolic panel  Result Value Ref Range   Sodium 139 135 - 145 mmol/L   Potassium 3.7 3.5 - 5.1 mmol/L   Chloride 103 101 - 111 mmol/L   CO2 25 22 - 32 mmol/L   Glucose, Bld 97 65 - 99 mg/dL   BUN 11 6 - 20 mg/dL   Creatinine, Ser 1.61 0.61 -  1.24 mg/dL   Calcium 9.5 8.9 - 09.6 mg/dL   GFR calc non Af Amer >60 >60 mL/min   GFR calc Af Amer >60 >60 mL/min   Anion gap 11 5 - 15  CBC  Result Value Ref Range   WBC 15.0 (H) 4.0 - 10.5 K/uL   RBC 5.51 4.22 - 5.81 MIL/uL   Hemoglobin 17.4 (H) 13.0 - 17.0 g/dL   HCT 04.5 40.9 - 81.1 %   MCV 88.7 78.0 - 100.0 fL   MCH 31.6 26.0 - 34.0 pg   MCHC 35.6 30.0 - 36.0 g/dL   RDW 91.4 78.2 - 95.6 %   Platelets 239 150 - 400 K/uL   Dg Chest Portable 1 View  09/21/2015  CLINICAL DATA:  Initial encounter for shortness of breath for 1 hour. EXAM: PORTABLE CHEST 1 VIEW COMPARISON:  08/26/2002. FINDINGS: 1346 hours. The lungs are clear wiithout focal pneumonia, edema, pneumothorax or pleural effusion. The cardiopericardial silhouette is within normal limits for size. The visualized bony structures of the thorax are intact. Telemetry leads overlie the chest. IMPRESSION: Normal exam. Electronically Signed   By: Kennith Center M.D.   On: 09/21/2015 13:55     I have personally reviewed and evaluated these images and lab results as part of my medical decision-making.   EKG Interpretation   Date/Time:  Sunday September 21 2015 13:16:45 EST Ventricular Rate:  172 PR Interval:    QRS Duration: 76 QT Interval:  236 QTC Calculation: 399 R Axis:   77 Text Interpretation:  Atrial fibrillation with rapid ventricular response  with premature ventricular or aberrantly conducted complexes Nonspecific  ST abnormality Confirmed by Denton Lank  MD, Caryn Bee ([id number]) on 09/21/2015 1:32:15  PM      MDM   Iv ns. Continuous pulse ox and monitor.  No change with vagal maneuvers.   Reviewed nursing notes and prior charts for additional history.   Reviewed chart, hx dvt, wpw, had ablation 2003.    Consulted cardiology, discussed with Dr Antoine Poche, who reviewed todays ecg, and prior notes - he indicates give iv metoprolol and if improved, d/c to home on metoprolol po prn, and outpt f/u.  Metoprolol 5 mg iv.   Recheck  hr 110, afib.  No chest pain or sob. No faintness.  On recheck hr 150.   Additional metoprolol given.  Shortly after hr 90-100, afib, but on recheck hr back in 120-130 range.  Will discuss disposition w cardiology.   Dr Antoine Poche sees in ED.  Discusses tx options w pt and opts for cardioversion. Written and verbal consent given.   Pt in nsr post procedure, fully awake and alert. Vitals normal.  Dr Antoine Poche requests that patient be placed on xarelto, 20 mg once a day, to be given 4 week supply, and to follow up in afib clinic in coming week, which he will arrange.   Pt denies any recent blood loss or bleeding.   Pt asymptomatic and appears stable for d/c.     Cathren Laine, MD 09/21/15 [phone]

## 2015-09-21 NOTE — Discharge Instructions (Signed)
It was our pleasure to provide your ER care today - we hope that you feel better.  Minimize caffeine use.  Take xarelto (blood thinner medication) as prescribed - take one 20 mg tablet once a day.   Take care to avoid falls, and no contact sports, while taking this medication.   Follow up in the AFIB clinic in the coming week - call office this Tuesday to verify appointment time.   Return to ER if worse, symptoms recur, persistent fast heart beat, fainting, trouble breathing,  severe headache, bleeding, other concern.  You were given sedating medication in the ER - no driving until tomorrow.       Atrial Fibrillation Atrial fibrillation is a type of irregular or rapid heartbeat (arrhythmia). In atrial fibrillation, the heart quivers continuously in a chaotic pattern. This occurs when parts of the heart receive disorganized signals that make the heart unable to pump blood normally. This can increase the risk for stroke, heart failure, and other heart-related conditions. There are different types of atrial fibrillation, including:  Paroxysmal atrial fibrillation. This type starts suddenly, and it usually stops on its own shortly after it starts.  Persistent atrial fibrillation. This type often lasts longer than a week. It may stop on its own or with treatment.  Long-lasting persistent atrial fibrillation. This type lasts longer than 12 months.  Permanent atrial fibrillation. This type does not go away. Talk with your health care provider to learn about the type of atrial fibrillation that you have. CAUSES This condition is caused by some heart-related conditions or procedures, including:  A heart attack.  Coronary artery disease.  Heart failure.  Heart valve conditions.  High blood pressure.  Inflammation of the sac that surrounds the heart (pericarditis).  Heart surgery.  Certain heart rhythm disorders, such as Wolf-Parkinson-White syndrome. Other causes  include:  Pneumonia.  Obstructive sleep apnea.  Blockage of an artery in the lungs (pulmonary embolism, or PE).  Lung cancer.  Chronic lung disease.  Thyroid problems, especially if the thyroid is overactive (hyperthyroidism).  Caffeine.  Excessive alcohol use or illegal drug use.  Use of some medicines, including certain decongestants and diet pills. Sometimes, the cause cannot be found. RISK FACTORS This condition is more likely to develop in:  People who are older in age.  People who smoke.  People who have diabetes mellitus.  People who are overweight (obese).  Athletes who exercise vigorously. SYMPTOMS Symptoms of this condition include:  A feeling that your heart is beating rapidly or irregularly.  A feeling of discomfort or pain in your chest.  Shortness of breath.  Sudden light-headedness or weakness.  Getting tired easily during exercise. In some cases, there are no symptoms. DIAGNOSIS Your health care provider may be able to detect atrial fibrillation when taking your pulse. If detected, this condition may be diagnosed with:  An electrocardiogram (ECG).  A Holter monitor test that records your heartbeat patterns over a 24-hour period.  Transthoracic echocardiogram (TTE) to evaluate how blood flows through your heart.  Transesophageal echocardiogram (TEE) to view more detailed images of your heart.  A stress test.  Imaging tests, such as a CT scan or chest X-ray.  Blood tests. TREATMENT The main goals of treatment are to prevent blood clots from forming and to keep your heart beating at a normal rate and rhythm. The type of treatment that you receive depends on many factors, such as your underlying medical conditions and how you feel when you are experiencing atrial  fibrillation. This condition may be treated with:  Medicine to slow down the heart rate, bring the heart's rhythm back to normal, or prevent clots from forming.  Electrical  cardioversion. This is a procedure that resets your heart's rhythm by delivering a controlled, low-energy shock to the heart through your skin.  Different types of ablation, such as catheter ablation, catheter ablation with pacemaker, or surgical ablation. These procedures destroy the heart tissues that send abnormal signals. When the pacemaker is used, it is placed under your skin to help your heart beat in a regular rhythm. HOME CARE INSTRUCTIONS  Take over-the counter and prescription medicines only as told by your health care provider.  If your health care provider prescribed a blood-thinning medicine (anticoagulant), take it exactly as told. Taking too much blood-thinning medicine can cause bleeding. If you do not take enough blood-thinning medicine, you will not have the protection that you need against stroke and other problems.  Do not use tobacco products, including cigarettes, chewing tobacco, and e-cigarettes. If you need help quitting, ask your health care provider.  If you have obstructive sleep apnea, manage your condition as told by your health care provider.  Do not drink alcohol.  Do not drink beverages that contain caffeine, such as coffee, soda, and tea.  Maintain a healthy weight. Do not use diet pills unless your health care provider approves. Diet pills may make heart problems worse.  Follow diet instructions as told by your health care provider.  Exercise regularly as told by your health care provider.  Keep all follow-up visits as told by your health care provider. This is important. PREVENTION  Avoid drinking beverages that contain caffeine or alcohol.  Avoid certain medicines, especially medicines that are used for breathing problems.  Avoid certain herbs and herbal medicines, such as those that contain ephedra or ginseng.  Do not use illegal drugs, such as cocaine and amphetamines.  Do not smoke.  Manage your high blood pressure. SEEK MEDICAL CARE  IF:  You notice a change in the rate, rhythm, or strength of your heartbeat.  You are taking an anticoagulant and you notice increased bruising.  You tire more easily when you exercise or exert yourself. SEEK IMMEDIATE MEDICAL CARE IF:  You have chest pain, abdominal pain, sweating, or weakness.  You feel nauseous.  You notice blood in your vomit, bowel movement, or urine.  You have shortness of breath.  You suddenly have swollen feet and ankles.  You feel dizzy.  You have sudden weakness or numbness of the face, arm, or leg, especially on one side of the body.  You have trouble speaking, trouble understanding, or both (aphasia).  Your face or your eyelid droops on one side. These symptoms may represent a serious problem that is an emergency. Do not wait to see if the symptoms will go away. Get medical help right away. Call your local emergency services (911 in the [place] Do not drive yourself to the hospital.   This information is not intended to replace advice given to you by your health care provider. Make sure you discuss any questions you have with your health care provider.   Document Released: 09/06/2005 Document Revised: 05/28/2015 Document Reviewed: 01/01/2015 Elsevier Interactive Patient Education 2016 Else[employer]  Rivaroxaban oral tablets What is this medicine? RIVAROXABAN (ri va ROX a ban) is an anticoagulant (blood thinner). It is used to treat blood clots in the lungs or in the veins. It is also used after knee or hip surgeries  to prevent blood clots. It is also used to lower the chance of stroke in people with a medical condition called atrial fibrillation. This medicine may be used for other purposes; ask your health care provider or pharmacist if you have questions. What should I tell my health care provider before I take this medicine? They need to know if you have any of these conditions: -bleeding disorders -bleeding in the brain -blood in your stools  (black or tarry stools) or if you have blood in your vomit -history of stomach bleeding -kidney disease -liver disease -low blood counts, like low white cell, platelet, or red cell counts -recent or planned spinal or epidural procedure -take medicines that treat or prevent blood clots -an unusual or allergic reaction to rivaroxaban, other medicines, foods, dyes, or preservatives -pregnant or trying to get pregnant -breast-feeding How should I use this medicine? Take this medicine by mouth with a glass of water. Follow the directions on the prescription label. Take your medicine at regular intervals. Do not take it more often than directed. Do not stop taking except on your doctor's advice. Stopping this medicine may increase your risk of a blood clot. Be sure to refill your prescription before you run out of medicine. If you are taking this medicine after hip or knee replacement surgery, take it with or without food. If you are taking this medicine for atrial fibrillation, take it with your evening meal. If you are taking this medicine to treat blood clots, take it with food at the same time each day. If you are unable to swallow your tablet, you may crush the tablet and mix it in applesauce. Then, immediately eat the applesauce. You should eat more food right after you eat the applesauce containing the crushed tablet. Talk to your pediatrician regarding the use of this medicine in children. Special care may be needed. Overdosage: If you think you have taken too much of this medicine contact a poison control center or emergency room at once. NOTE: This medicine is only for you. Do not share this medicine with others. What if I miss a dose? If you take your medicine once a day and miss a dose, take the missed dose as soon as you remember. If you take your medicine twice a day and miss a dose, take the missed dose immediately. In this instance, 2 tablets may be taken at the same time. The next day you  should take 1 tablet twice a day as directed. What may interact with this medicine? -aspirin and aspirin-like medicines -certain antibiotics like erythromycin, azithromycin, and clarithromycin -certain medicines for fungal infections like ketoconazole and itraconazole -certain medicines for irregular heart beat like amiodarone, quinidine, dronedarone -certain medicines for seizures like carbamazepine, phenytoin -certain medicines that treat or prevent blood clots like warfarin, enoxaparin, and dalteparin -conivaptan -diltiazem -felodipine -indinavir -lopinavir; ritonavir -NSAIDS, medicines for pain and inflammation, like ibuprofen or naproxen -ranolazine -rifampin -ritonavir -St. John's wort -verapamil This list may not describe all possible interactions. Give your health care provider a list of all the medicines, herbs, non-prescription drugs, or dietary supplements you use. Also tell them if you smoke, drink alcohol, or use illegal drugs. Some items may interact with your medicine. What should I watch for while using this medicine? Visit your doctor or health care professional for regular checks on your progress. Your condition will be monitored carefully while you are receiving this medicine. Notify your doctor or health care professional and seek emergency treatment if you develop  breathing problems; changes in vision; chest pain; severe, sudden headache; pain, swelling, warmth in the leg; trouble speaking; sudden numbness or weakness of the face, arm, or leg. These can be signs that your condition has gotten worse. If you are going to have surgery, tell your doctor or health care professional that you are taking this medicine. Tell your health care professional that you use this medicine before you have a spinal or epidural procedure. Sometimes people who take this medicine have bleeding problems around the spine when they have a spinal or epidural procedure. This bleeding is very rare.  If you have a spinal or epidural procedure while on this medicine, call your health care professional immediately if you have back pain, numbness or tingling (especially in your legs and feet), muscle weakness, paralysis, or loss of bladder or bowel control. Avoid sports and activities that might cause injury while you are using this medicine. Severe falls or injuries can cause unseen bleeding. Be careful when using sharp tools or knives. Consider using an Neurosurgeon. Take special care brushing or flossing your teeth. Report any injuries, bruising, or red spots on the skin to your doctor or health care professional. What side effects may I notice from receiving this medicine? Side effects that you should report to your doctor or health care professional as soon as possible: -allergic reactions like skin rash, itching or hives, swelling of the face, lips, or tongue -back pain -redness, blistering, peeling or loosening of the skin, including inside the mouth -signs and symptoms of bleeding such as bloody or black, tarry stools; red or dark-brown urine; spitting up blood or brown material that looks like coffee grounds; red spots on the skin; unusual bruising or bleeding from the eye, gums, or nose Side effects that usually do not require medical attention (Report these to your doctor or health care professional if they continue or are bothersome.): -dizziness -muscle pain This list may not describe all possible side effects. Call your doctor for medical advice about side effects. You may report side effects to FDA at 1-800-FDA-1088. Where should I keep my medicine? Keep out of the reach of children. Store at room temperature between 15 and 30 degrees C (59 and 86 degrees F). Throw away any unused medicine after the expiration date. NOTE: This sheet is a summary. It may not cover all possible information. If you have questions about this medicine, talk to your doctor, pharmacist, or health care  provider.    2016, Elsevier/Gold Standard. (2014-09-04 12:45:34)

## 2015-09-21 NOTE — Consult Note (Signed)
CARDIOLOGY CONSULT NOTE  Patient ID: ARTEZ REGIS MRN: [id number] DOB/AGE: [birth year] 29 y.o.  Admit date: 09/21/2015 Primary Physician None Primary Cardiologist Dr. 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    He has been treated in the past with PRN beta blockers.  He was last seen in our clinic in 2015 with palpitations.  He developed tachy palpitations at work today.  He had been out celebrating New Years Eve.  He felt OK until about 11:30.  He then started having racing heart beat with light headedness.  He cam the the ED and had an irregular rhythm with intermittent wide complex beats.  He was treated with IV beta blocker and would slow temporarily.  EKG demonstrated atrial fib.   He otherwise feels well.  The patient denies any new symptoms such as chest discomfort, neck or arm discomfort. There has been no new shortness of breath, PND or orthopnea. There have been no reported palpitations, presyncope or syncope.  Past Medical History  Diagnosis Date  . SVT (supraventricular tachycardia) (HCC)   . Acne   . Genital herpes   . PSVT (paroxysmal supraventricular tachycardia) (HCC)   . Possible exposure to STD   . 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 Chlamydia trachomatis infection     Past Surgical History  Procedure Laterality Date  . Svt ablation  2003  . Ablation of dysrhythmic focus  2003    No Known Allergies  (Not in a hospital admission) Family History  Problem Relation Age of Onset  . Heart attack Mother   . Hypertension Mother   . Heart disease Mother   . Diabetes Mother   . Heart attack Father   . Heart disease Father   . Diabetes Father   . Early death Father     Social History   Social History  . Marital Status: Single    Spouse Name: N/A  . Number of Children: 2  . Years of Education: N/A   Occupational History  . Works at [employer]    Social  History Main Topics  . Smoking status: Never Smoker   . Smokeless tobacco: Not on file  . Alcohol Use: Yes     Comment: 1 drink per week  . Drug Use: 5.00 per week    Special: Marijuana  . Sexual Activity: Not on file   Other Topics Concern  . Not on file   Social History Narrative     ROS:    As stated in the HPI and negative for all other systems.  Physical Exam: Blood pressure 124/96, pulse 126, temperature 98.6 F (37 C), temperature source Oral, resp. rate 31, SpO2 96 %.  GENERAL:  Well appearing HEENT:  Pupils equal round and reactive, fundi not visualized, oral mucosa unremarkable NECK:  No jugular venous distention, waveform within normal limits, carotid upstroke brisk and symmetric, no bruits, no thyromegaly LYMPHATICS:  No cervical, inguinal adenopathy LUNGS:  Clear to auscultation bilaterally BACK:  No CVA tenderness CHEST:  Unremarkable HEART:  PMI not displaced or sustained,S1 and S2 within normal limits, no S3, no S4, no clicks, no rubs, no murmurs ABD:  Flat, positive bowel sounds normal in frequency in pitch, no bruits, no rebound, no guarding, no midline pulsatile mass, no hepatomegaly, no splenomegaly EXT:  2 plus pulses throughout, no edema, no cyanosis no clubbing SKIN:  No rashes  no nodules NEURO:  Cranial nerves II through XII grossly intact, motor grossly intact throughout PSYCH:  Cognitively intact, oriented to person place and time   Labs: Lab Results  Component Value Date   BUN 11 09/21/2015   Lab Results  Component Value Date   CREATININE 0.94 09/21/2015   Lab Results  Component Value Date   NA 139 09/21/2015   K 3.7 09/21/2015   CL 103 09/21/2015   CO2 25 09/21/2015   No results found for: TROPONINI Lab Results  Component Value Date   WBC 15.0* 09/21/2015   HGB 17.4* 09/21/2015   HCT 48.9 09/21/2015   MCV 88.7 09/21/2015   PLT 239 09/21/2015    Radiology:   CXR:   1346 hours. The lungs are clear wiithout focal pneumonia,  edema, pneumothorax or pleural effusion. The cardiopericardial silhouette is within normal limits for size. The visualized bony structures of the thorax are intact. Telemetry leads overlie the chest.  EKG:    Atrial fib rate 124, no acute ST T wave changes.  09/21/2015  ASSESSMENT AND PLAN:   ATRIAL FIB:  Given severity of symptoms and the fact that this clearly new onset today we proceeded with DCCV.  Per our protocol he will be discharged from the ED on Xarelto.  Risk benefits of this have been discussed with the patient and his family.  After cardioversion he will be followed in the atrial fib clinic.  Mr. Lemar LoftyDavid L Whalin has a CHA2DS2 - VASc score of 0 with a risk of stroke of 0%.   PROCEDURE NOTE:    Procedure:   DCCV  Indication:  Symptomatic atrial fib.  Procedure Note:  The patient signed informed consent.  He has had been in atrial fib for less than 48 hours. .  Anesthesia was administered by Dr. Denton LankSteinl.  Adequate airway was maintained throughout and vital followed per protocol.  He was cardioverted x 1 with 150J of biphasic synchronized energy.  He converted to NSR.  There were no apparent complications.  The patient had normal neuro status and respiratory status post procedure with vitals stable as recorded elsewhere.    Follow up:  We will arrange follow up with the atrial fib clinic.  He will continue on current medical therapy.      SignedRollene Rotunda: Janne Faulk 09/21/2015, 3:53 PM

## 2015-09-21 NOTE — ED Notes (Signed)
Cadiology MD at bedside.

## 2015-10-01 ENCOUNTER — Encounter (HOSPITAL_COMMUNITY): Payer: Self-pay | Admitting: Nurse Practitioner

## 2015-10-01 ENCOUNTER — Ambulatory Visit (HOSPITAL_COMMUNITY)
Admission: RE | Admit: 2015-10-01 | Discharge: 2015-10-01 | Disposition: A | Discharge status: Home / Self Care | Payer: No Typology Code available for payment source | Source: Ambulatory Visit | Attending: Nurse Practitioner | Admitting: Nurse Practitioner

## 2015-10-01 VITALS — BP 136/90 | HR 92 | Ht 74.0 in | Wt 201.4 lb

## 2015-10-01 DIAGNOSIS — I480 Paroxysmal atrial fibrillation: Secondary | ICD-10-CM | POA: Diagnosis not present

## 2015-10-01 MED ORDER — DILTIAZEM HCL 30 MG PO TABS: ORAL_TABLET | ORAL | Status: DC

## 2015-10-01 NOTE — Patient Instructions (Signed)
Your physician has recommended you make the following change in your medication:   1)Cardizem 30mg -- take 1 tablet every 4 hours AS NEEDED for heart rate >100 as long as blood pressure >100.   

## 2015-10-01 NOTE — Progress Notes (Signed)
Patient ID: Roberto Bowen, male   DOB: 05/18/1986, 30 y.o.   MRN: [id number]    Primary Care Physician: Beverely Low, MD Referring Physician: ER f/u Electrophysiologist: Dr. Suzan Slick is a 30 y.o. 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 He presented to the ER 1/1 with afib with v rate 175 which followed partying on New years eve. He states he will drink 2-3 beers a week, occasional red bulls and marijuana. He felt chest pressure and shortness of breath with the episode. He underwent successful DCCV. He is on xarelto and know he has to take x one month. He does not have prn drug to take even though MAR states metoprolol.   Today, he denies symptoms of palpitations, chest pain, shortness of breath, orthopnea, PND, lower extremity edema, dizziness, presyncope, syncope, or neurologic sequela. The patient is tolerating medications without difficulties and is otherwise without complaint today.   Past Medical History  Diagnosis Date  . SVT (supraventricular tachycardia) (HCC)   . Genital herpes   . PSVT (paroxysmal supraventricular tachycardia) (HCC)   . 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 Chlamydia trachomatis infection    Past Surgical History  Procedure Laterality Date  . Svt ablation  2003  . Ablation of dysrhythmic focus  2003    Current Outpatient Prescriptions  Medication Sig Dispense Refill  . rivaroxaban (XARELTO) 20 MG TABS tablet Take 1 tablet (20 mg total) by mouth daily with supper. 27 tablet 0  . diltiazem (CARDIZEM) 30 MG tablet Cardizem 30mg  -- take 1 tablet every 4 hours AS NEEDED for heart rate >100 as long as blood pressure >100. 45 tablet 1   No current facility-administered medications for this encounter.    No Known Allergies  Social History   Social History  . Marital Status: Single    Spouse Name: N/A  . Number of Children: 2  . Years of Education: N/A   Occupational History  . Works at  [employer]    Social History Main Topics  . Smoking status: Never Smoker   . Smokeless tobacco: Not on file  . Alcohol Use: Yes     Comment: 1 drink per week  . Drug Use: 5.00 per week    Special: Marijuana  . Sexual Activity: Not on file   Other Topics Concern  . Not on file   Social History Narrative    Family History  Problem Relation Age of Onset  . Heart attack Mother 63  . Hypertension Mother   . Diabetes Mother   . Heart attack Father 31  . Diabetes Father     ROS- All systems are reviewed and negative except as per the HPI above  Physical Exam: Filed Vitals:   10/01/15 1514  BP: 136/90  Pulse: 92  Height: 6\' 2"  (1.88 m)  Weight: 201 lb 6.4 oz (91.354 kg)    GEN- The patient is well appearing, alert and oriented x 3 today.   Head- normocephalic, atraumatic Eyes-  Sclera clear, conjunctiva pink Ears- hearing intact Oropharynx- clear Neck- supple, no JVP Lymph- no cervical lymphadenopathy Lungs- Clear to ausculation bilaterally, normal work of breathing Heart- Regular rate and rhythm, no murmurs, rubs or gallops, PMI not laterally displaced GI- soft, NT, ND, + BS Extremities- no clubbing, cyanosis, or edema MS- no significant deformity or atrophy Skin- no rash or lesion Psych- euthymic mood, full affect Neuro- strength and sensation are intact  EKG- NSR at 92 bpm, pr int 144 ms, qrs int 84 ms, qtc 415 ms Epic records reviewed  Assessment and Plan: 1. afib with rvr Discussed lifestyle and to cut back on caffeine, alcohol, marijuana, red bull Continue xarelto x one month Cardizem 30 mg one every 4- 6 hours for paf  F/u in one month  Lupita LeashDonna C. Matthew Folksarroll, ANP-C [facility] [address] [place], [place] [id number] [phone]

## 2015-11-05 ENCOUNTER — Ambulatory Visit (HOSPITAL_COMMUNITY)
Admission: RE | Admit: 2015-11-05 | Discharge: 2015-11-05 | Disposition: A | Discharge status: Home / Self Care | Payer: No Typology Code available for payment source | Source: Ambulatory Visit | Attending: Nurse Practitioner | Admitting: Nurse Practitioner

## 2015-11-05 ENCOUNTER — Encounter (HOSPITAL_COMMUNITY): Payer: Self-pay | Admitting: Nurse Practitioner

## 2015-11-05 VITALS — BP 126/70 | HR 99 | Ht 74.0 in | Wt 209.0 lb

## 2015-11-05 DIAGNOSIS — Z7901 Long term (current) use of anticoagulants: Secondary | ICD-10-CM | POA: Insufficient documentation

## 2015-11-05 DIAGNOSIS — F1010 Alcohol abuse, uncomplicated: Secondary | ICD-10-CM | POA: Insufficient documentation

## 2015-11-05 DIAGNOSIS — F1210 Cannabis abuse, uncomplicated: Secondary | ICD-10-CM | POA: Insufficient documentation

## 2015-11-05 DIAGNOSIS — I456 Pre-excitation syndrome: Secondary | ICD-10-CM | POA: Insufficient documentation

## 2015-11-05 DIAGNOSIS — I4891 Unspecified atrial fibrillation: Secondary | ICD-10-CM | POA: Diagnosis present

## 2015-11-05 DIAGNOSIS — Z833 Family history of diabetes mellitus: Secondary | ICD-10-CM | POA: Insufficient documentation

## 2015-11-05 DIAGNOSIS — B009 Herpesviral infection, unspecified: Secondary | ICD-10-CM | POA: Insufficient documentation

## 2015-11-05 DIAGNOSIS — Z8249 Family history of ischemic heart disease and other diseases of the circulatory system: Secondary | ICD-10-CM | POA: Insufficient documentation

## 2015-11-05 DIAGNOSIS — I471 Supraventricular tachycardia: Secondary | ICD-10-CM | POA: Insufficient documentation

## 2015-11-05 DIAGNOSIS — A749 Chlamydial infection, unspecified: Secondary | ICD-10-CM | POA: Insufficient documentation

## 2015-11-05 DIAGNOSIS — I480 Paroxysmal atrial fibrillation: Secondary | ICD-10-CM

## 2015-11-05 NOTE — Progress Notes (Signed)
Patient ID: Roberto Bowen, male   DOB: 1986-02-20, 30 y.o.   MRN: [id number]    Primary Care Physician: Beverely Low, MD Referring Physician: ER f/u Electrophysiologist: Dr. Suzan Slick is a 30 y.o. 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 He presented to the ER 1/1 with afib with v rate 175 which followed partying on New years eve. He states he will drink 2-3 beers a week, occasional red bulls and marijuana. He felt chest pressure and shortness of breath with the episode. He underwent successful DCCV. He is on xarelto and know he has to take x one month. He does not have prn drug to take even though MAR states metoprolol.   He returns today after completing his 30 days of DOAC. No further heart irregularity. Has not picked up pill in pocket Cardizem, to help if he has another attack. Will go back to pharmacy today and pick up. He is not drinking any red bulls, has cut down on caffeine. Still is smoking some marijuana.  Today, he denies symptoms of palpitations, chest pain, shortness of breath, orthopnea, PND, lower extremity edema, dizziness, presyncope, syncope, or neurologic sequela. The patient is tolerating medications without difficulties and is otherwise without complaint today.   Past Medical History  Diagnosis Date  . SVT (supraventricular tachycardia) (HCC)   . Genital herpes   . PSVT (paroxysmal supraventricular tachycardia) (HCC)   . 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 Chlamydia trachomatis infection    Past Surgical History  Procedure Laterality Date  . Svt ablation  2003  . Ablation of dysrhythmic focus  2003    Current Outpatient Prescriptions  Medication Sig Dispense Refill  . diltiazem (CARDIZEM) 30 MG tablet Cardizem  -- take 1 tablet every 4 hours AS NEEDED for heart rate >100 as long as blood pressure >100. (Patient not taking: Reported on 11/05/2015) 45 tablet 1  . rivaroxaban (XARELTO) 20 MG TABS  tablet Take 1 tablet (20 mg total) by mouth daily with supper. (Patient not taking: Reported on 11/05/2015) 27 tablet 0   No current facility-administered medications for this encounter.    No Known Allergies  Social History   Social History  . Marital Status: Single    Spouse Name: N/A  . Number of Children: 2  . Years of Education: N/A   Occupational History  . Works at [employer]    Social History Main Topics  . Smoking status: Never Smoker   . Smokeless tobacco: Not on file  . Alcohol Use: Yes     Comment: 1 drink per week  . Drug Use: 5.00 per week    Special: Marijuana  . Sexual Activity: Not on file   Other Topics Concern  . Not on file   Social History Narrative    Family History  Problem Relation Age of Onset  . Heart attack Mother 85  . Hypertension Mother   . Diabetes Mother   . Heart attack Father 39  . Diabetes Father     ROS- All systems are reviewed and negative except as per the HPI above  Physical Exam: Filed Vitals:   11/05/15 1531  BP: 126/70  Pulse: 99  Height:  (1.88 m)  Weight: 209 lb (94.802 kg)    GEN- The patient is well appearing, alert and oriented x 3 today.   Head- normocephalic, atraumatic Eyes-  Sclera clear, conjunctiva pink Ears- hearing intact Oropharynx- clear  Neck- supple, no JVP Lymph- no cervical lymphadenopathy Lungs- Clear to ausculation bilaterally, normal work of breathing Heart- Regular rate and rhythm, no murmurs, rubs or gallops, PMI not laterally displaced GI- soft, NT, ND, + BS Extremities- no clubbing, cyanosis, or edema MS- no significant deformity or atrophy Skin- no rash or lesion Psych- euthymic mood, full affect Neuro- strength and sensation are intact  EKG- NSR at 92 bpm, pr int 144 ms, qrs int 84 ms, qtc 415 ms Epic records reviewed  Assessment and Plan: 1. afib with rvr Reminded to  cut back on caffeine, alcohol, marijuana, red bull Finished xarelto with chadsvasc score of  0 Cardizem 30 mg one every 4- 6 hours  as needed for rapid heart beat.  F/u as needed  Elvina Sidle. [facility] [address] [place], [place] [address] [phone]

## 2016-12-08 IMAGING — CR DG CHEST 1V PORT
2 series · 2 of 2 positions shown · non-contrast
Comparison: 08/26/2002.

CLINICAL DATA: Initial encounter for shortness of breath for 1
hour.

EXAM:
PORTABLE CHEST 1 VIEW

[AP (1 of 2)]
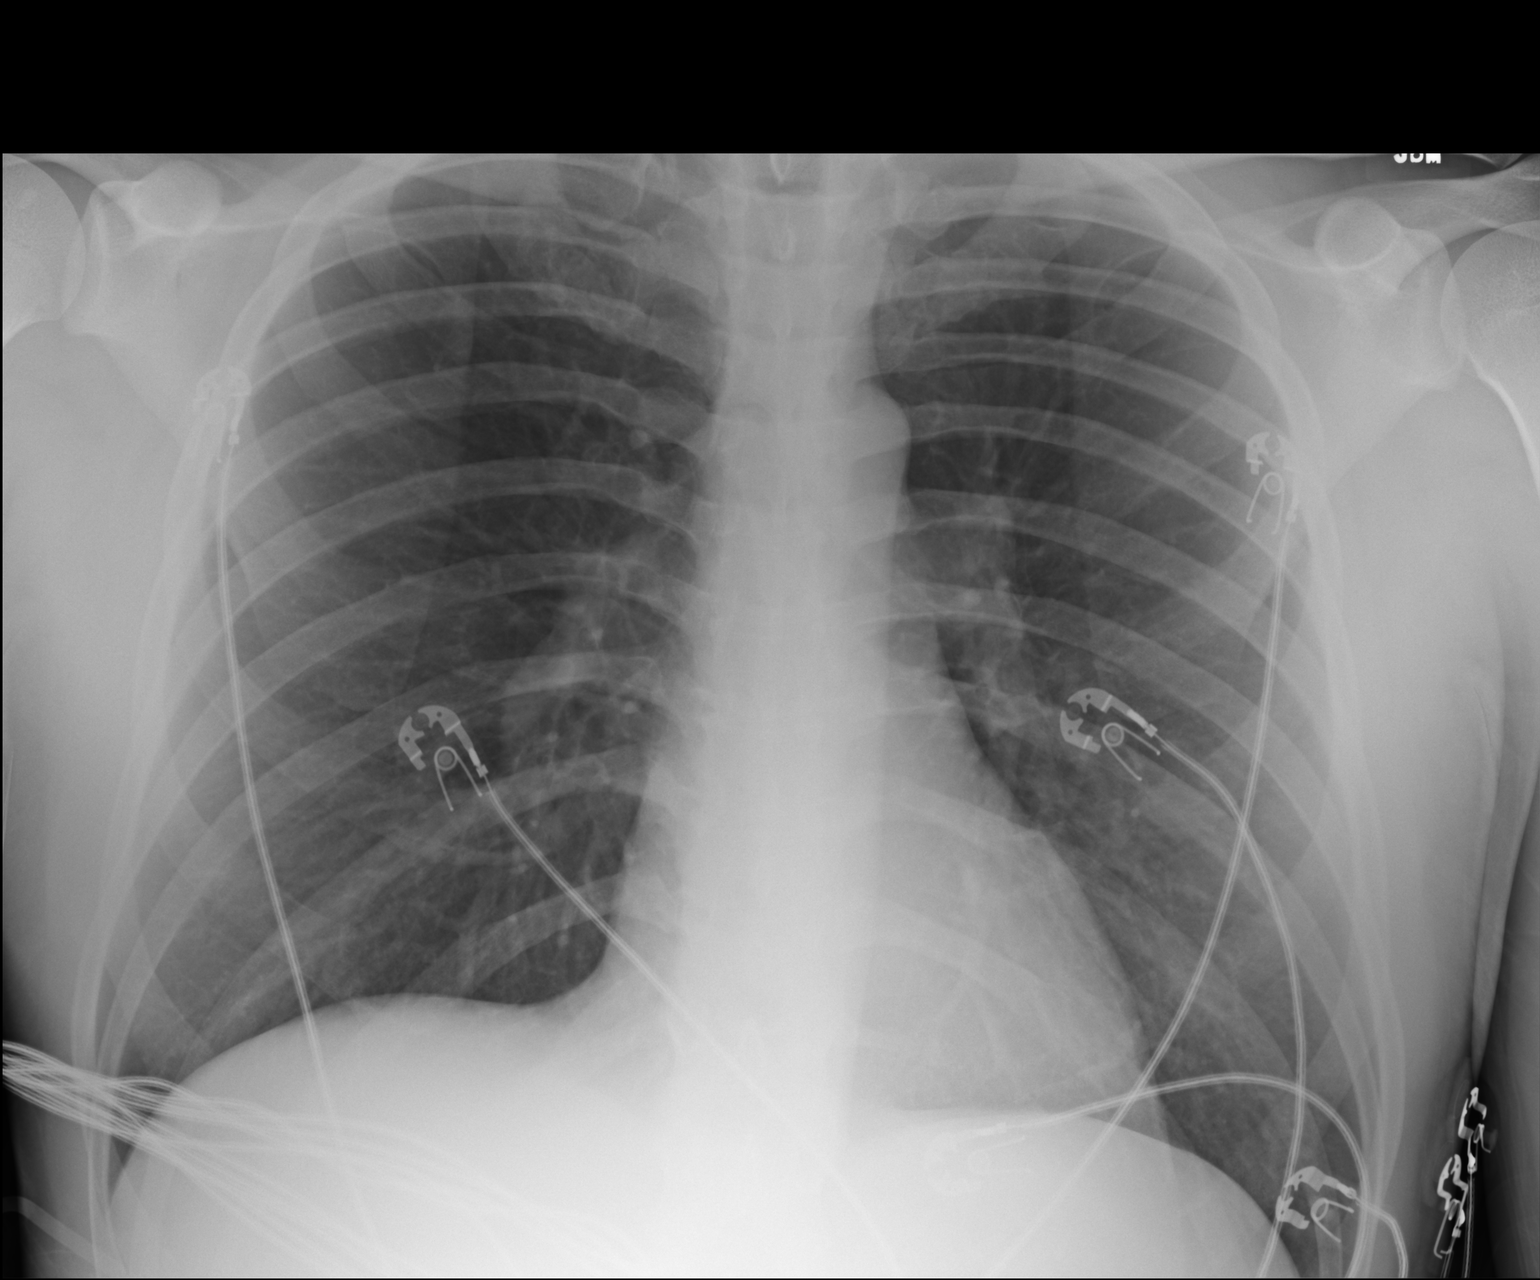

[AP (2 of 2)]
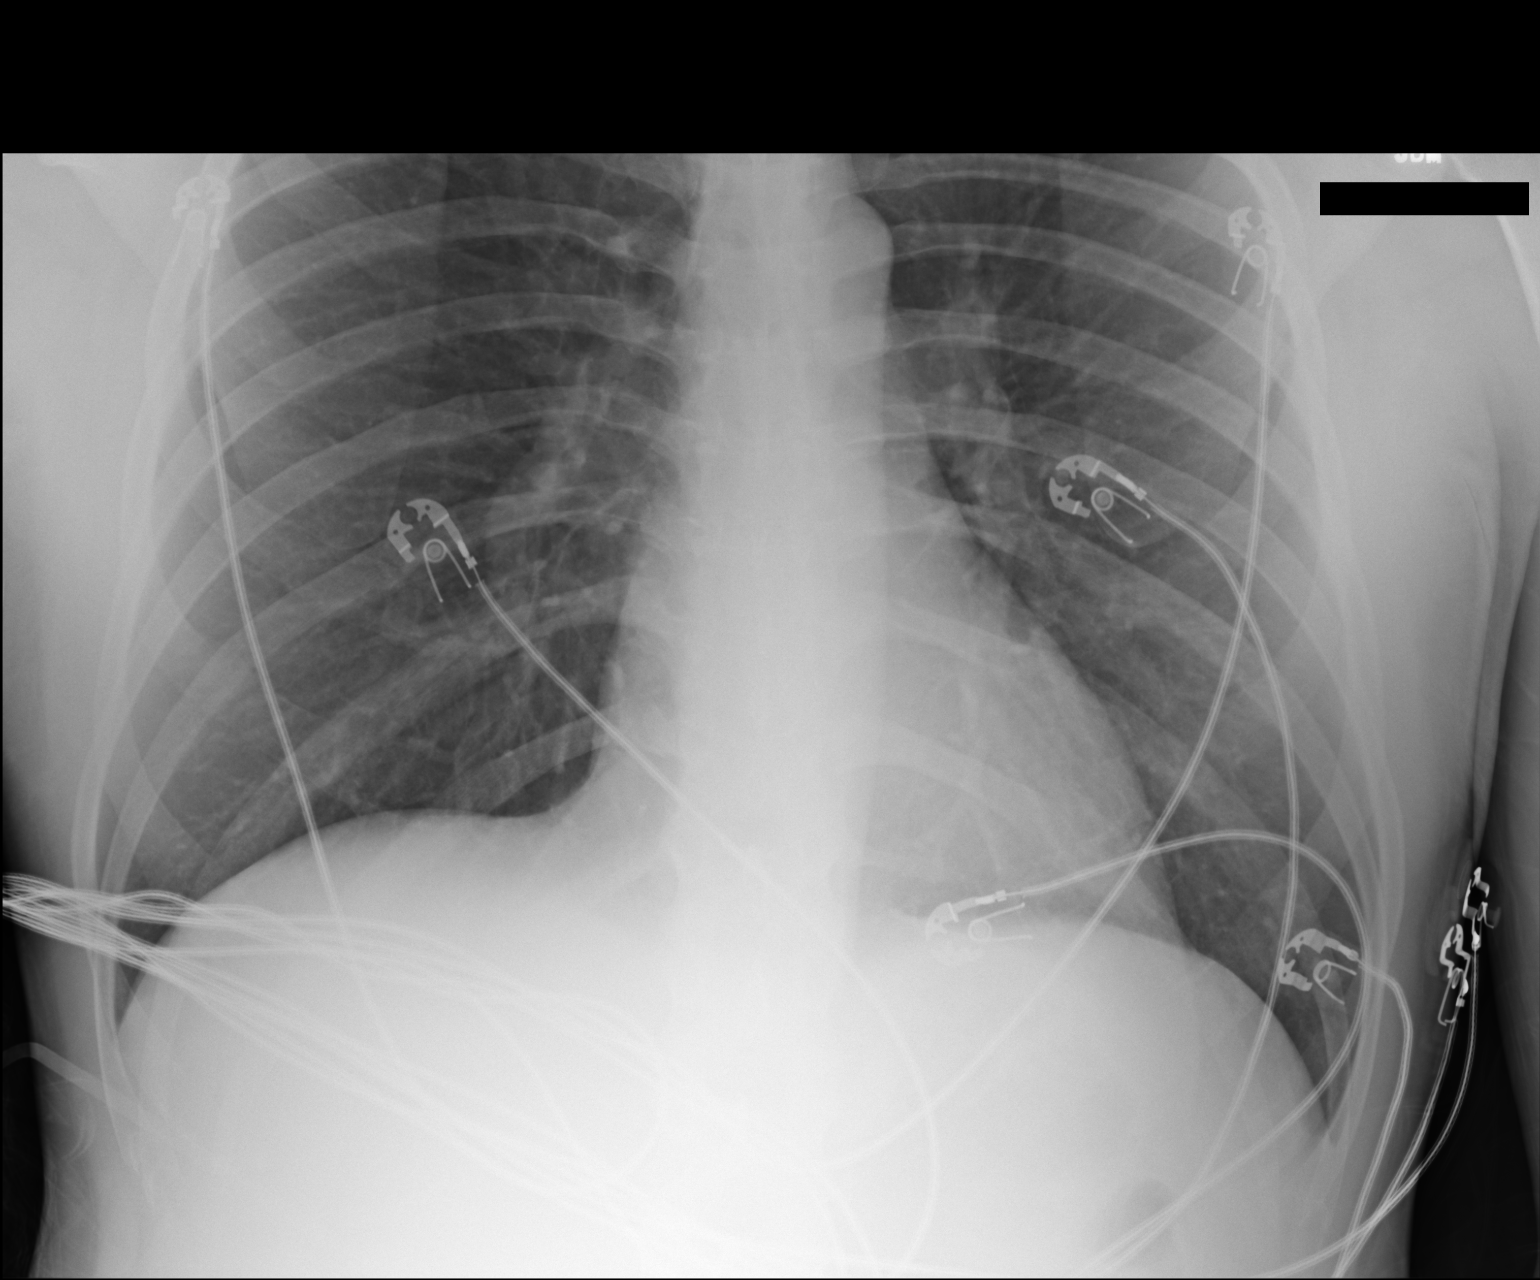

[2 of 2 positions shown; findings below may reference images not displayed]

FINDINGS: 5702 hours. The lungs are clear wiithout focal pneumonia, edema,
pneumothorax or pleural effusion. The cardiopericardial silhouette
is within normal limits for size. The visualized bony structures of
the thorax are intact. Telemetry leads overlie the chest.
IMPRESSION: Normal exam.

## 2018-07-08 ENCOUNTER — Ambulatory Visit (HOSPITAL_COMMUNITY)
Admission: EM | Admit: 2018-07-08 | Discharge: 2018-07-08 | Disposition: A | Discharge status: Home / Self Care | Payer: PRIVATE HEALTH INSURANCE | Attending: Internal Medicine | Admitting: Internal Medicine

## 2018-07-08 ENCOUNTER — Encounter (HOSPITAL_COMMUNITY): Payer: Self-pay | Admitting: Emergency Medicine

## 2018-07-08 ENCOUNTER — Other Ambulatory Visit: Payer: Self-pay

## 2018-07-08 DIAGNOSIS — K0889 Other specified disorders of teeth and supporting structures: Secondary | ICD-10-CM

## 2018-07-08 MED ORDER — IBUPROFEN 800 MG PO TABS: 800.0000 mg | ORAL_TABLET | Freq: Three times a day (TID) | ORAL | 0 refills | Status: AC

## 2018-07-08 MED ORDER — AMOXICILLIN 500 MG PO TABS: 500.0000 mg | ORAL_TABLET | Freq: Two times a day (BID) | ORAL | 0 refills | Status: AC

## 2018-07-08 NOTE — ED Provider Notes (Signed)
[facility]    CSN: [id number] Arrival date & time: 07/08/18  1646     History   Chief Complaint Chief Complaint  Patient presents with  . Dental Pain    HPI Roberto Bowen is a 32 y.o. male.   32 year old male presents with dental pain since dental extraction on Monday.  Condition is persistent and worsening in nature.  Patient is made worse by talking and eating.  Condition is made better by nothing.  Patient denies any relief from her tramadol and Tylenol taken prior to arrival this facility.  Patient denies any fevers or facial swelling.     Past Medical History:  Diagnosis Date  . Chlamydia trachomatis infection   . Genital herpes   . PSVT (paroxysmal supraventricular tachycardia) (HCC)   . SVT (supraventricular tachycardia) (HCC)   . 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 Chlamydia trachomatis infection 02/21/2014  . Possible exposure to STD 02/18/2014  . 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 GENITAL HERPES 12/13/2008  . SUPRAVENTRICULAR TACHYCARDIA, PAROXYSMAL, HX OF 12/13/2008  . ACNE 11/17/2006    Past Surgical History:  Procedure Laterality Date  . ABLATION OF DYSRHYTHMIC FOCUS  2003  . SVT Ablation  2003       Home Medications    Prior to Admission medications   Not on File    Family History Family History  Problem Relation Age of Onset  . Heart attack Mother 31  . Hypertension Mother   . Diabetes Mother   . Heart attack Father 50  . Diabetes Father     Social History Social History   Tobacco Use  . Smoking status: Never Smoker  Substance Use Topics  . Alcohol use: Yes    Comment: 1 drink per week  . Drug use: Yes    Frequency: 5.0 times per week    Types: Marijuana     Allergies   Patient has no known allergies.   Review of Systems Review of Systems  Constitutional: Negative for chills and fever.    HENT: Positive for dental problem. Negative for ear pain and sore throat.   Eyes: Negative for pain and visual disturbance.  Respiratory: Negative for cough and shortness of breath.   Cardiovascular: Negative for chest pain and palpitations.  Gastrointestinal: Negative for abdominal pain and vomiting.  Genitourinary: Negative for dysuria and hematuria.  Musculoskeletal: Negative for arthralgias and back pain.  Skin: Negative for color change and rash.  Neurological: Negative for seizures and syncope.  All other systems reviewed and are negative.    Physical Exam Triage Vital Signs ED Triage Vitals  Enc Vitals Group     BP 07/08/18 1736 (!) 165/92     Pulse Rate 07/08/18 1736 (!) 59     Resp 07/08/18 1736 18     Temp 07/08/18 1736 99.1 F (37.3 C)     Temp Source 07/08/18 1736 Oral     SpO2 07/08/18 1736 100 %     Weight --      Height --      Head Circumference --      Peak Flow --      Pain Score 07/08/18 1735 8     Pain Loc --      Pain Edu? --      Excl. in GC? --    No data found.  Updated Vital Signs  BP (!) 165/92 (BP Location: Right Arm)   Pulse (!) 59   Temp 99.1 F (37.3 C) (Oral)   Resp 18   SpO2 100%   Visual Acuity Right Eye Distance:   Left Eye Distance:   Bilateral Distance:    Right Eye Near:   Left Eye Near:    Bilateral Near:     Physical Exam  Constitutional: He appears well-developed and well-nourished.  HENT:  Head: Normocephalic and atraumatic.  Dental extraction noted to bottom left molar.  Eyes: Conjunctivae are normal.  Neck: Neck supple.  Cardiovascular: Normal rate and regular rhythm.  No murmur heard. Pulmonary/Chest: Effort normal and breath sounds normal. No respiratory distress.  Abdominal: Soft. There is no tenderness.  Musculoskeletal: He exhibits no edema.  Neurological: He is alert.  Skin: Skin is warm and dry.  Psychiatric: He has a normal mood and affect.  Nursing note and vitals reviewed.    UC Treatments /  Results  Labs (all labs ordered are listed, but only abnormal results are displayed) Labs Reviewed - No data to display  EKG None  Radiology No results found.  Procedures Procedures (including critical care time)  Medications Ordered in UC Medications - No data to display  Initial Impression / Assessment and Plan / UC Course  I have reviewed the triage vital signs and the nursing notes.  Pertinent labs & imaging results that were available during my care of the patient were reviewed by me and considered in my medical decision making (see chart for details).      Final Clinical Impressions(s) / UC Diagnoses   Final diagnoses:  None   Discharge Instructions   None    ED Prescriptions    None     Controlled Substance Prescriptions [place] Controlled Substance Registry consulted? Not Applicable   Alene Mires, NP 07/08/18 1749

## 2018-07-08 NOTE — ED Triage Notes (Signed)
The patient presented to the [facility] with a complaint of dental pain x 2 days. The patient reported that he had a tooth extracted 5 days ago.

## 2018-07-08 NOTE — Discharge Instructions (Signed)
His follow-up with dentist.
